# Patient Record
Sex: Male | Born: 2000 | Race: White | Hispanic: No | Marital: Single
Health system: Southern US, Community
[De-identification: ages and names within clinical notes are randomized; demographics above are authoritative.]

---
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
## 2022-03-27 ENCOUNTER — Emergency Department (HOSPITAL_COMMUNITY)
Admission: EM | Admit: 2022-03-27 | Discharge: 2022-03-27 | Disposition: A | Discharge status: Home / Self Care | Payer: BC Managed Care – PPO | Attending: Emergency Medicine | Admitting: Emergency Medicine

## 2022-03-27 ENCOUNTER — Other Ambulatory Visit: Payer: Self-pay

## 2022-03-27 ENCOUNTER — Encounter (HOSPITAL_COMMUNITY): Payer: Self-pay

## 2022-03-27 DIAGNOSIS — S0990XA Unspecified injury of head, initial encounter: Secondary | ICD-10-CM

## 2022-03-27 DIAGNOSIS — M542 Cervicalgia: Secondary | ICD-10-CM | POA: Diagnosis not present

## 2022-03-27 DIAGNOSIS — Y9366 Activity, soccer: Secondary | ICD-10-CM | POA: Diagnosis not present

## 2022-03-27 DIAGNOSIS — Y92322 Soccer field as the place of occurrence of the external cause: Secondary | ICD-10-CM | POA: Insufficient documentation

## 2022-03-27 DIAGNOSIS — W2102XA Struck by soccer ball, initial encounter: Secondary | ICD-10-CM | POA: Insufficient documentation

## 2022-03-27 DIAGNOSIS — S0101XA Laceration without foreign body of scalp, initial encounter: Secondary | ICD-10-CM | POA: Insufficient documentation

## 2022-03-27 MED ORDER — LIDOCAINE HCL (PF) 1 % IJ SOLN
30.0000 mL | Freq: Once | INTRAMUSCULAR | Status: AC
  Administered 2022-03-27: 30 mL
  Filled 2022-03-27: qty 30

## 2022-03-27 MED ORDER — TETANUS-DIPHTH-ACELL PERTUSSIS 5-2.5-18.5 LF-MCG/0.5 IM SUSY
0.5000 mL | PREFILLED_SYRINGE | Freq: Once | INTRAMUSCULAR | Status: DC
  Filled 2022-03-27: qty 0.5

## 2022-03-27 NOTE — Discharge Instructions (Signed)
We evaluated you in the emergency department for your head injury.  We repaired your laceration.  Please have the sutures and staples out in approximately 10 to 14 days.  You can take the sutures out in around 7 days or wait until the staples are due to be removed.  Please monitor your symptoms at home.  If you develop any severe headaches, vomiting, vision changes, fainting, numbness or tingling, weakness, please return immediately to the emergency department.  You may have some symptoms of headache, dizziness, nausea over the next few days, which could be caused by a concussion.  Please follow-up closely with student health.  Please wash your scalp normally with soap and water, do not scrub vigorously but let it run over the laceration.  If you develop any increased pain, redness, drainage of pus, or any other concerning symptoms, please return to the emergency department for a wound recheck.

## 2022-03-27 NOTE — ED Provider Triage Note (Signed)
Emergency Medicine Provider Triage Evaluation Note  Andrew Hurley , a 21 y.o. male  was evaluated in triage.  Pt complains of head laceration. 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 No LOC. Admits to nausea. No emesis. Unsure when his last tetanus shot was  Review of Systems  Positive: Nausea, head lac Negative: emesis  Physical Exam  BP (!) 169/71 (BP Location: Left Arm)   Pulse 62   Temp 98.9 F (37.2 C) (Oral)   Resp 18   Ht 6\' 1"  (1.854 m)   Wt 86.2 kg   SpO2 96%   BMI 25.07 kg/m  Gen:   Awake, no distress   Resp:  Normal effort  MSK:   Moves extremities without difficulty  Other:  Laceration to right side of head, no focal deficits  Medical Decision Making  Medically screening exam initiated at 8:42 PM.  Appropriate orders placed.  Andrew Hurley was informed that the remainder of the evaluation will be completed by another provider, this initial triage assessment does not replace that evaluation, and the importance of remaining in the ED until their evaluation is complete.  Shared decision making, mother/patient agree to obtain CT head Tetanus booster Patient will require laceration repair   Beryle Flock, PA-C 03/27/22 2043

## 2022-03-27 NOTE — ED Triage Notes (Addendum)
Pt reports when knee to left side of head during game with emesis x1 30 mins PTA Laceration noted. Bleeding controled. Denies LOC and BT usage.

## 2022-03-27 NOTE — ED Provider Notes (Signed)
[facility]-EMERGENCY DEPT Provider Note  CSN: [id number] Arrival date & time: 03/27/22 2003  Chief Complaint(s) Head Laceration  HPI Andrew Hurley is a 21 y.o. male with no past medical history presenting to the emergency department with head injury.  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  Developed laceration of the head.  Reports initial mild headache, nausea, which has subsequently resolved.  No vomiting.  No loss of consciousness, vision changes.  No numbness or tingling.  No dizziness.  Reports mild bilateral neck pain described as "stiffness ".  No pain to the extremities, chest, abdomen.  Past Medical History History reviewed. No pertinent past medical history. There are no problems to display for this patient.  Home Medication(s) Prior to Admission medications   Not on File                                                                                                                                    Past Surgical History History reviewed. No pertinent surgical history. Family History History reviewed. No pertinent family history.  Social History   Allergies Penicillins  Review of Systems Review of Systems  All other systems reviewed and are negative.   Physical Exam Vital Signs  I have reviewed the triage vital signs BP (!) 168/70   Pulse 60   Temp 98 F (36.7 C)   Resp 18   Ht 6\' 1"  (1.854 m)   Wt 86.2 kg   SpO2 96%   BMI 25.07 kg/m  Physical Exam Vitals and nursing note reviewed.  Constitutional:      General: He is not in acute distress.    Appearance: Normal appearance.  HENT:     Head: Normocephalic.     Mouth/Throat:     Mouth: Mucous membranes are moist.  Eyes:     Conjunctiva/sclera: Conjunctivae normal.  Neck:     Comments: No midline C-spine tenderness, no T or L-spine tenderness.  Mild bilateral paraspinal cervical tenderness.  Able to range neck to the  left and right without difficulty Cardiovascular:     Rate and Rhythm: Normal rate and regular rhythm.  Pulmonary:     Effort: Pulmonary effort is normal. No respiratory distress.     Breath sounds: Normal breath sounds.  Abdominal:     General: Abdomen is flat.     Palpations: Abdomen is soft.     Tenderness: There is no abdominal tenderness.  Musculoskeletal:     Right lower leg: No edema.     Left lower leg: No edema.  Skin:    General: Skin is warm and dry.     Capillary Refill: Capillary refill takes less than 2 seconds.     Comments: Approximately 9 cm laceration to the forehead extending into the scalp, explored full depth of wound, galea intact  Neurological:  Mental Status: He is alert and oriented to person, place, and time. Mental status is at baseline.     Comments: Cranial nerves II through XII intact, strength 5 out of 5 in the bilateral upper and lower extremities, no sensory deficit to light touch, no dysmetria on finger-nose-finger testing, ambulatory with steady gait.   Psychiatric:        Mood and Affect: Mood normal.        Behavior: Behavior normal.     ED Results and Treatments Labs (all labs ordered are listed, but only abnormal results are displayed) Labs Reviewed - No data to display                                                                                                                        Radiology No results found.  Pertinent labs & imaging results that were available during my care of the patient were reviewed by me and considered in my medical decision making (see MDM for details).  Medications Ordered in ED Medications  Tdap (BOOSTRIX) injection 0.5 mL (0.5 mLs Intramuscular Patient Refused/Not Given 03/27/22 2250)  lidocaine (PF) (XYLOCAINE) 1 % injection 30 mL (30 mLs Infiltration Given by Other 03/27/22 2146)                                                                                                                                      Procedures .[name] KitchenLaceration Repair  Date/Time: 03/27/2022 10:53 PM  Performed by: Lonell Grandchild, MD Authorized by: Lonell Grandchild, MD   Consent:    Consent obtained:  Verbal   Consent given by:  Patient   Risks, benefits, and alternatives were discussed: yes     Risks discussed:  Infection, pain, retained foreign body, poor cosmetic result, need for additional repair, nerve damage and poor wound healing   Alternatives discussed:  No treatment Universal protocol:    Procedure explained and questions answered to patient or proxy's satisfaction: yes     Patient identity confirmed:  Verbally with patient and arm band Anesthesia:    Anesthesia method:  Local infiltration   Local anesthetic:  Lidocaine 1% w/o epi Laceration details:    Location:  Scalp   Scalp location:  Frontal   Length (cm):  9 Exploration:    Limited defect created (wound extended): no     Hemostasis achieved with:  Direct pressure   Wound  exploration: wound explored through full range of motion and entire depth of wound visualized     Wound extent: no fascia violation noted     Contaminated: no   Treatment:    Area cleansed with:  Saline   Amount of cleaning:  Extensive   Irrigation solution:  Sterile saline   Irrigation method:  Syringe   Visualized foreign bodies/material removed: no     Debridement:  None   Undermining:  None   Scar revision: no     Layers/structures repaired:  Deep subcutaneous Deep subcutaneous:    Suture size:  5-0   Suture material:  Vicryl   Number of sutures:  5 Skin repair:    Repair method:  Sutures and staples   Suture size:  6-0   Suture material:  Prolene   Suture technique:  Simple interrupted   Number of sutures:  4 (forehead)   Number of staples:  10 (scalp only) Approximation:    Approximation:  Close Repair type:    Repair type:  Complex Post-procedure details:    Procedure completion:  Tolerated well, no immediate complications   (including  critical care time)  Medical Decision Making / ED Course   MDM:  21 year old male presenting to the emergency department after head injury.  Patient well-appearing, neurologic exam nonfocal.  Physical exam notable for laceration to the left anterior scalp extending into the forehead.  Tetanus updated.  Given no loss of consciousness, vomiting, no persistent headaches, no other dangerous symptoms, low concern for intracranial injury.  Discussed risks and benefits of CT scan with the patient and his mother, engaged in shared decision making, they agree with deferring head CT at this time, feel comfortable with monitoring symptoms at home and strict return precautions for any worsening symptoms.  Repaired laceration to the scalp without difficulty.  Discussed return precautions as well as timeline for wound check and suture/staple removal. Will discharge patient to home. All questions answered. Patient comfortable with plan of discharge. Return precautions discussed with patient and specified on the after visit summary.       Additional history obtained: -Additional history obtained from family -External records from outside source obtained and reviewed including: Chart review including previous notes, labs, imaging, consultation notes   Lab Tests: -I ordered, reviewed, and interpreted labs.   The pertinent results include:   Labs Reviewed - No data to display    EKG   EKG Interpretation  Date/Time:    Ventricular Rate:    PR Interval:    QRS Duration:   QT Interval:    QTC Calculation:   R Axis:     Text Interpretation:             Medicines ordered and prescription drug management: Meds ordered this encounter  Medications   Tdap (BOOSTRIX) injection 0.5 mL   lidocaine (PF) (XYLOCAINE) 1 % injection 30 mL    -I have reviewed the patients home medicines and have made adjustments as needed   Social Determinants of Health:  Factors impacting patients care include:  plays sports   Reevaluation: After the interventions noted above, I reevaluated the patient and found that they have improved  Co morbidities that complicate the patient evaluation History reviewed. No pertinent past medical history.    Dispostion: Discharge    Final Clinical Impression(s) / ED Diagnoses Final diagnoses:  Laceration of scalp, initial encounter  Minor head injury, initial encounter     This chart was dictated using voice recognition software.  Despite best efforts to proofread,  errors can occur which can change the documentation meaning.    Lonell Grandchild, MD 03/27/22 2255
